# Patient Record
Sex: Male | Born: 1965 | Race: White | Hispanic: No | Marital: Married | Smoking: Never smoker
Health system: Southern US, Community
[De-identification: ages and names within clinical notes are randomized; demographics above are authoritative.]

## PROBLEM LIST (undated history)

## (undated) HISTORY — PX: MANDIBLE SURGERY: SHX707

---

## 2019-06-24 ENCOUNTER — Emergency Department: Payer: PRIVATE HEALTH INSURANCE

## 2019-06-24 ENCOUNTER — Encounter: Payer: Self-pay | Admitting: Radiology

## 2019-06-24 ENCOUNTER — Emergency Department
Admission: EM | Admit: 2019-06-24 | Discharge: 2019-06-24 | Disposition: A | Discharge status: Home / Self Care | Payer: PRIVATE HEALTH INSURANCE | Attending: Student | Admitting: Student

## 2019-06-24 ENCOUNTER — Other Ambulatory Visit: Payer: Self-pay

## 2019-06-24 DIAGNOSIS — G9389 Other specified disorders of brain: Secondary | ICD-10-CM

## 2019-06-24 DIAGNOSIS — R471 Dysarthria and anarthria: Secondary | ICD-10-CM | POA: Insufficient documentation

## 2019-06-24 DIAGNOSIS — Z79899 Other long term (current) drug therapy: Secondary | ICD-10-CM | POA: Insufficient documentation

## 2019-06-24 DIAGNOSIS — Z8639 Personal history of other endocrine, nutritional and metabolic disease: Secondary | ICD-10-CM | POA: Insufficient documentation

## 2019-06-24 DIAGNOSIS — I10 Essential (primary) hypertension: Secondary | ICD-10-CM | POA: Diagnosis not present

## 2019-06-24 DIAGNOSIS — R4789 Other speech disturbances: Secondary | ICD-10-CM | POA: Diagnosis present

## 2019-06-24 LAB — COMPREHENSIVE METABOLIC PANEL
ALT: 48 U/L — ABNORMAL HIGH (ref 0–44)
AST: 42 U/L — ABNORMAL HIGH (ref 15–41)
Albumin: 4.3 g/dL (ref 3.5–5.0)
Alkaline Phosphatase: 68 U/L (ref 38–126)
Anion gap: 9 (ref 5–15)
BUN: 19 mg/dL (ref 6–20)
CO2: 25 mmol/L (ref 22–32)
Calcium: 9.3 mg/dL (ref 8.9–10.3)
Chloride: 99 mmol/L (ref 98–111)
Creatinine, Ser: 0.9 mg/dL (ref 0.61–1.24)
GFR calc Af Amer: >60 mL/min (ref >60)
GFR calc non Af Amer: >60 mL/min (ref >60)
Glucose, Bld: 154 mg/dL — ABNORMAL HIGH (ref 70–99)
Potassium: 4 mmol/L (ref 3.5–5.1)
Sodium: 133 mmol/L — ABNORMAL LOW (ref 135–145)
Total Bilirubin: 1.5 mg/dL — ABNORMAL HIGH (ref 0.3–1.2)
Total Protein: 9.7 g/dL — ABNORMAL HIGH (ref 6.5–8.1)

## 2019-06-24 LAB — DIFFERENTIAL
Abs Immature Granulocytes: 0.05 10*3/uL (ref 0.00–0.07)
Basophils Absolute: 0 10*3/uL (ref 0.0–0.1)
Basophils Relative: 0 %
Eosinophils Absolute: 0 10*3/uL (ref 0.0–0.5)
Eosinophils Relative: 0 %
Immature Granulocytes: 1 %
Lymphocytes Relative: 18 %
Lymphs Abs: 2 10*3/uL (ref 0.7–4.0)
Monocytes Absolute: 0.4 10*3/uL (ref 0.1–1.0)
Monocytes Relative: 4 %
Neutro Abs: 8.3 10*3/uL — ABNORMAL HIGH (ref 1.7–7.7)
Neutrophils Relative %: 77 %

## 2019-06-24 LAB — CBC
HCT: 43.8 % (ref 39.0–52.0)
Hemoglobin: 15 g/dL (ref 13.0–17.0)
MCH: 32.9 pg (ref 26.0–34.0)
MCHC: 34.2 g/dL (ref 30.0–36.0)
MCV: 96.1 fL (ref 80.0–100.0)
Platelets: 351 10*3/uL (ref 150–400)
RBC: 4.56 MIL/uL (ref 4.22–5.81)
RDW: 12 % (ref 11.5–15.5)
WBC: 10.8 10*3/uL — ABNORMAL HIGH (ref 4.0–10.5)
nRBC: 0 % (ref 0.0–0.2)

## 2019-06-24 LAB — PROTIME-INR
INR: 1 (ref 0.8–1.2)
Prothrombin Time: 13.5 seconds (ref 11.4–15.2)

## 2019-06-24 LAB — APTT: aPTT: 27 seconds (ref 24–36)

## 2019-06-24 MED ORDER — PREDNISONE 10 MG PO TABS: ORAL_TABLET | ORAL | 0 refills | Status: AC

## 2019-06-24 MED ORDER — IOHEXOL 300 MG/ML  SOLN
125.0000 mL | Freq: Once | INTRAMUSCULAR | Status: AC | PRN
  Administered 2019-06-24: 125 mL via INTRAVENOUS

## 2019-06-24 MED ORDER — GADOBUTROL 1 MMOL/ML IV SOLN
10.0000 mL | Freq: Once | INTRAVENOUS | Status: AC | PRN
  Administered 2019-06-24: 10 mL via INTRAVENOUS

## 2019-06-24 MED ORDER — SODIUM CHLORIDE 0.9% FLUSH: 3.0000 mL | Freq: Once | INTRAVENOUS | Status: DC

## 2019-06-24 NOTE — ED Notes (Signed)
E-signature not working at this time. Pt verbalized understanding of D/C instructions, prescriptions and follow up care with no further questions at this time. Pt in NAD and ambulatory at time of D/C.  

## 2019-06-24 NOTE — ED Notes (Signed)
Pt ambulated to bathroom with a steady gait. Wife at bedside.

## 2019-06-24 NOTE — ED Provider Notes (Addendum)
[facility] Emergency Department Provider Note  ____________________________________________   First MD Initiated Contact with Patient 06/24/19 1446     (approximate)  I have reviewed the triage vital signs and the nursing notes.  History  Chief Complaint Aphasia    HPI Charles Ayers is a 54 y.o. male with history of hypertension, HLD who presents emergency department for speech difficulties. Patient states symptoms first started on Thursday, 4/8. They seem to start acutely around 8:30 AM. States he woke up around 6 AM and was at his baseline. Since 8:30 AM on Thursday he has had difficulty talking. He largely attributes this to difficulty coordinating his tongue. He feels like he cannot move his tongue like he normally does, especially having difficulty pressing it against his bottom teeth. He is having some difficulty swallowing because of this discoordination as well. No difficulty breathing. Has had a mild, pressure like headache to the occiput area for about a month. Denies any other associated neurological symptoms. No visual changes, facial droop, extremity weakness, numbness, or tingling. Denies any history of similar symptoms. No unintentional weight loss, night sweats. No personal history of cancer.   Past Medical Hx HTN HLD  Problem List HTN HLD  Past Surgical Hx Non-contributory  Medications Statin Telmisartan-HCTZ  Allergies Patient has no allergy information on record.  Family Hx No family history on file.  Social Hx Social History   Tobacco Use  . Smoking status: Not on file  Substance Use Topics  . Alcohol use: Not on file  . Drug use: Not on file     Review of Systems  Constitutional: Negative for fever. Negative for chills. Eyes: Negative for visual changes. ENT: Negative for sore throat. Cardiovascular: Negative for chest pain. Respiratory: Negative for shortness of breath. Gastrointestinal: Negative for nausea.  Negative for vomiting.  Genitourinary: Negative for dysuria. Musculoskeletal: Negative for leg swelling. Skin: Negative for rash. Neurological: Positive for headaches. Positive for dysarthria, abnormal tongue coordination.   Physical Exam  Vital Signs: ED Triage Vitals  Enc Vitals Group     BP 06/24/19 1346 (!) 179/99     Pulse Rate 06/24/19 1346 (!) 113     Resp 06/24/19 1346 18     Temp 06/24/19 1355 98.6 F (37 C)     Temp Source 06/24/19 1346 Oral     SpO2 06/24/19 1346 98 %     Weight 06/24/19 1346 265 lb (120.2 kg)     Height 06/24/19 1346 6' (1.829 m)     Head Circumference --      Peak Flow --      Pain Score 06/24/19 1345 5     Pain Loc --      Pain Edu? --      Excl. in [place]? --     Constitutional: Alert and oriented. Well appearing. NAD.  Head: Normocephalic. Atraumatic. Eyes: Conjunctivae clear. Sclera anicteric. Pupils equal and symmetric. EOMI. Nose: No masses or lesions. No congestion or rhinorrhea. Mouth/Throat: Wearing mask.  Neck: No stridor. Trachea midline.  Cardiovascular: Normal rate, regular rhythm. Extremities well perfused. Respiratory: Normal respiratory effort.  Lungs CTAB. Gastrointestinal: Soft. Non-distended. Non-tender.  Genitourinary: Deferred. Musculoskeletal: No lower extremity edema. No deformities. Neurologic: Alert and oriented. Mild dysarthria. No word finding difficulties or aphasia. Unable to press tongue against lower inner teeth. UE and LE strength 5/5 and symmetric. SILT.  Skin: Skin is warm, dry and intact. No rash noted. Psychiatric: Mood and affect are appropriate for situation.  EKG  Personally reviewed and interpreted by myself.   Date: 06/24/19 Time: 1422 Rate: 113 Rhythm: sinus Axis: borderline leftward Intervals: WNL Sinus tachycardia No STEMI    Radiology  Personally reviewed available imaging myself.   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 The  mass is  expansile and slightly hyperdense to brain. The mass is  mildly expansile and there may be some early intracranial extension  of tumor posteriorly. No internal calcifications are seen.  Differential diagnosis includes chordoma, chondroid tumor, and  metastatic disease. The patient had no given history of malignancy  at this time. MRI skull base without with contrast recommended for  further evaluation    Procedures  Procedure(s) performed (including critical care):  Procedures   Initial Impression / Assessment and Plan / MDM / ED Course  54 y.o. male who presents to the ED for dysarthria and difficulty w/ tongue coordination. Exam as above.   CT scan initiated in triage reveals a large central skull base tumor. Discussed w/ radiology and Neurosurgery Dr. Adriana Simas, added on MRI imaging per their recommendation. Advised discussed with [facility] for further guidance as well (patient/family requests [facility] care if need for transfer).   Discussed case with Dr. Zachery Conch at [facility] (tumor specialist) who states based on MRI imaging he reviewed, patient would be okay for close outpatient follow up as the mass is not creating obstructive effects on brain stem. Does not require emergent surgical intervention/admission at this time. Dr. Zachery Conch would be more than happy to still see the patient as ED to ED transfer if family desires in person evaluation/conversation with him.   Family states they would like to talk to NSGY, but would feel comfortable talking to Dr. Adriana Simas here, as he has been in touch with Dr. Zachery Conch regarding the case as well. This is preferable for them as it would obviate the need for transfer.  Dr. Adriana Simas will evaluate patient here and discuss case. Anticipate discharge after imaging results/discussion with Dr. Adriana Simas pending no major changes.   _______________________________   As part of my medical decision making I have reviewed available labs, radiology tests, reviewed old records/chart  review, obtained additional history from family, and discussed with consultants (radiology, Dr. Chestine Spore, and NSGY, Dr. Adriana Simas).     Final Clinical Impression(s) / ED Diagnosis  Final diagnoses:  Dysarthria  Brain mass       Note:  This document was prepared using Dragon voice recognition software and may include unintentional dictation errors.     Miguel Aschoff., MD 06/24/19 [phone]

## 2019-06-24 NOTE — Consult Note (Signed)
Neurosurgery-New Consultation Evaluation 06/24/2019 Charles Ayers [id number]  Identifying Statement: Charles Ayers is a 54 y.o. male from [place] [address] with speech difficulty and tongue weakness  Physician Requesting Consultation: [facility]  History of Present Illness: Charles Ayers is here for evaluation of dysarthria and tongue difficulty that he is noted started approximately a week ago.  He denies any numbness, weakness, vision changes, or hearing changes.  He does have some chronic decreased right-sided hearing.  He does not recall any inciting events or gradual decline and  developed with rather rapid onset.  On arrival to the emergency department, he did undergo CT of the head which revealed a lytic lesion within the clivus and went for MRI of the brain for further evaluation.  He has no personal history of malignancy.  Past Medical History:  No significant past medical history  Social History: Social History   Socioeconomic History  . Marital status: Married    Spouse name: Not on file  . Number of children: Not on file  . Years of education: Not on file  . Highest education level: Not on file  Occupational History  . Not on file  Tobacco Use  . Smoking status: Not on file  Substance and Sexual Activity  . Alcohol use: Not on file  . Drug use: Not on file  . Sexual activity: Not on file  Other Topics Concern  . Not on file  Social History Narrative  . Not on file   Social Determinants of Health   Financial Resource Strain:   . Difficulty of Paying Living Expenses:   Food Insecurity:   . Worried About Programme researcher, broadcasting/film/video in the Last Year:   . Barista in the Last Year:   Transportation Needs:   . Freight forwarder (Medical):   Marland Kitchen Lack of Transportation (Non-Medical):   Physical Activity:   . Days of Exercise per Week:   . Minutes of Exercise per Session:   Stress:   . Feeling of Stress :   Social Connections:   .  Frequency of Communication with Friends and Family:   . Frequency of Social Gatherings with Friends and Family:   . Attends Religious Services:   . Active Member of Clubs or Organizations:   . Attends Banker Meetings:   Marland Kitchen Marital Status:   Intimate Partner Violence:   . Fear of Current or Ex-Partner:   . Emotionally Abused:   Marland Kitchen Physically Abused:   . Sexually Abused:    Living arrangements (living alone, with partner): Arrived with spouse  Family History: No history of neurologic malignancy  Review of Systems:  Review of Systems - General ROS: Negative Psychological ROS: Negative Ophthalmic ROS: Negative ENT ROS: Negative Hematological and Lymphatic ROS: Negative  Endocrine ROS: Negative Respiratory ROS: Negative Cardiovascular ROS: Negative Gastrointestinal ROS: Negative Genito-Urinary ROS: Negative Musculoskeletal ROS: Negative Neurological ROS: Positive for tongue weakness Dermatological ROS: Negative  Physical Exam: BP (!) 160/97   Pulse (!) 109   Temp 98.6 F (37 C)   Resp 17   Ht 6' (1.829 m)   Wt 120.2 kg   SpO2 97%   BMI 35.94 kg/m  Body mass index is 35.94 kg/m. Body surface area is 2.47 meters squared. General appearance: Alert, cooperative, in no acute distress Head: Normocephalic, atraumatic Eyes: Normal, EOM intact Oropharynx: Wearing facemask Neck: Supple, range of motion appears full Ext: No edema in LE bilaterally, warm extremities  Neurologic exam:  Mental status: alertness: alert, orientation: person, place, time, affect: normal Speech: fluent but mildly dysarthric Cranial nerves:  II: Visual fields are full by confrontation, no ptosis III/IV/VI: extra-ocular motions intact bilaterally V/VII:no evidence of facial droop or weakness and facial sensation intact VIII: hearing decreased slightly to finger rub on the right XI: trapezius strength symmetric,  sternocleidomastoid strength symmetric XII: tongue deviates mildly to the  right although there is some atrophy noted on the left Motor:strength symmetric 5/5 in bilateral upper and lower extremities Sensory: intact to light touch in all extremities Gait: Not tested  Laboratory: Results for orders placed or performed during the hospital encounter of 06/24/19  Protime-INR  Result Value Ref Range   Prothrombin Time 13.5 11.4 - 15.2 seconds   INR 1.0 0.8 - 1.2  APTT  Result Value Ref Range   aPTT 27 24 - 36 seconds  CBC  Result Value Ref Range   WBC 10.8 (H) 4.0 - 10.5 K/uL   RBC 4.56 4.22 - 5.81 MIL/uL   Hemoglobin 15.0 13.0 - 17.0 g/dL   HCT 43.8 39.0 - 52.0 %   MCV 96.1 80.0 - 100.0 fL   MCH 32.9 26.0 - 34.0 pg   MCHC 34.2 30.0 - 36.0 g/dL   RDW 12.0 11.5 - 15.5 %   Platelets 351 150 - 400 K/uL   nRBC 0.0 0.0 - 0.2 %  Differential  Result Value Ref Range   Neutrophils Relative % 77 %   Neutro Abs 8.3 (H) 1.7 - 7.7 K/uL   Lymphocytes Relative 18 %   Lymphs Abs 2.0 0.7 - 4.0 K/uL   Monocytes Relative 4 %   Monocytes Absolute 0.4 0.1 - 1.0 K/uL   Eosinophils Relative 0 %   Eosinophils Absolute 0.0 0.0 - 0.5 K/uL   Basophils Relative 0 %   Basophils Absolute 0.0 0.0 - 0.1 K/uL   Immature Granulocytes 1 %   Abs Immature Granulocytes 0.05 0.00 - 0.07 K/uL  Comprehensive metabolic panel  Result Value Ref Range   Sodium 133 (L) 135 - 145 mmol/L   Potassium 4.0 3.5 - 5.1 mmol/L   Chloride 99 98 - 111 mmol/L   CO2 25 22 - 32 mmol/L   Glucose, Bld 154 (H) 70 - 99 mg/dL   BUN 19 6 - 20 mg/dL   Creatinine, Ser 0.90 0.61 - 1.24 mg/dL   Calcium 9.3 8.9 - 10.3 mg/dL   Total Protein 9.7 (H) 6.5 - 8.1 g/dL   Albumin 4.3 3.5 - 5.0 g/dL   AST 42 (H) 15 - 41 U/L   ALT 48 (H) 0 - 44 U/L   Alkaline Phosphatase 68 38 - 126 U/L   Total Bilirubin 1.5 (H) 0.3 - 1.2 mg/dL   GFR calc non Af Amer >60 >60 mL/min   GFR calc Af Amer >60 >60 mL/min   Anion gap 9 5 - 15   I personally reviewed labs  Imaging: Results for orders placed during the hospital  encounter of 06/24/19  MR Brain W and Wo Contrast   Narrative CLINICAL DATA:  Skull base mass.  EXAM: MRI HEAD WITHOUT AND WITH CONTRAST  TECHNIQUE: Multiplanar, multiecho pulse sequences of the brain and surrounding structures were obtained without and with intravenous contrast.  CONTRAST:  52mL GADAVIST GADOBUTROL 1 MMOL/ML IV SOLN  COMPARISON:  CT head 06/24/2019  FINDINGS: Brain: No acute infarction, hemorrhage, hydrocephalus, extra-axial collection or mass lesion. Few tiny subcortical white matter hyperintensities bilaterally. Normal enhancement of the brain.  Vascular: Normal arterial flow voids.  Skull and upper cervical spine: Large skull base mass measuring approximately 21 x 45 mm. The mass is hypointense to brain on T1 and T2 and shows homogeneous enhancement. The mass is in the central skull base in the midline extending more to the right than the left. Mass infiltrates most of the clivus and extends into the basiocciput. No calcifications were present on CT. The mass overlies the hypoglossal canal bilaterally accounting for tongue symptoms. There appears to be destruction of the posterior wall of the clivus inferiorly with probable tumor extending into the epidural space specially on the right. No mass-effect on the brainstem.  No other skeletal lesions are identified.  Sinuses/Orbits: Mild mucosal edema paranasal sinuses.  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 This shows bony destruction on CT and no tumoral calcification. The hypoglossal canal is involved by the mass accounting for tongue symptoms. Mild epidural tumor extension posteriorly particularly on the right.  Signal characteristics and lack of calcifications decrease the chances of this being a chordoma. Chondrosarcoma also less likely due to lack of calcifications. Metastatic disease possible however there is no known primary malignancy at this time.  Intraosseous meningioma is a consideration.  Neuro surgical evaluation recommended.   Electronically Signed   By: Marlan Palau M.D.   On: 06/24/2019 16:38    I personally reviewed radiology studies    Impression/Plan:  Charles Ayers is here for evaluation of what appears to be a large clival lesion.  We will get imaging to rule out any other signs of primary disease elsewhere.  Given his new symptoms we did discuss that we would recommend treatment which would consist of at minimum a biopsy but also likely resection given the location.  We discussed that this is usually done with colleagues from ENT and we will get them established as an outpatient in order to discuss further treatment.  I have recommended a short course of steroids today to see if this would help with his acute symptoms.   1.  Diagnosis: Large clival lesion  2.  Plan -Place referral as an outpatient for discussion of skull base tumor resection -Recommend body imaging to rule out any other primary disease -Safe for discharge from ED after imaging obtained

## 2019-06-24 NOTE — ED Notes (Signed)
Pt in MRI.

## 2019-06-24 NOTE — ED Triage Notes (Signed)
Pt c/o slurred speech and difficulty controlling his tongue sine last Thursday 4/8 around 830am, states he woke up fine around 6am that day. Pt has also c/o posterior head pain. Denies any injury or recent falls.

## 2019-06-24 NOTE — ED Provider Notes (Signed)
-----------------------------------------   6:40 PM on 06/24/2019 -----------------------------------------  I took over care of this patient from Dr. Colon Branch.  Dr. Adriana Simas from neurosurgery called with additional recommendations after speaking to the patient.  He requested a CT of the chest, abdomen, and pelvis for cancer staging and recommended that I start the patient on a taper of prednisone.  The CTs were obtained and are negative for acute findings other than a 4 mm lung nodule.  The patient is to follow-up at the skull base center at [facility], and will be contacted by the providers there.  I discussed the results of the CTs with him.  I confirmed that he understood the discharge instructions.  Return precautions were given, and he expressed understanding.   Dionne Bucy, MD 06/24/19 [phone]

## 2019-06-24 NOTE — Discharge Instructions (Addendum)
Thank you for letting us take care of you in the ER today.   Please call Dr. Santiago Glad [facility] Neurosurgery) number below to schedule a clinic appointment. If you do not hear back from the clinic for scheduling, you can call Dr. Zachery Conch at his cell phone number at [phone].   Please return to the ER for any new or worsening symptoms, including worsening headache, difficulty swallowing, breathing, or any other signs/symptoms concerning to you.

## 2019-07-07 ENCOUNTER — Other Ambulatory Visit: Payer: Self-pay | Admitting: Neurosurgery

## 2019-07-07 DIAGNOSIS — R1310 Dysphagia, unspecified: Secondary | ICD-10-CM

## 2019-07-07 DIAGNOSIS — G939 Disorder of brain, unspecified: Secondary | ICD-10-CM

## 2019-07-09 ENCOUNTER — Ambulatory Visit
Admission: RE | Admit: 2019-07-09 | Discharge: 2019-07-09 | Disposition: A | Discharge status: Home / Self Care | Payer: PRIVATE HEALTH INSURANCE | Source: Ambulatory Visit | Attending: Neurosurgery | Admitting: Neurosurgery

## 2019-07-09 ENCOUNTER — Other Ambulatory Visit: Payer: Self-pay

## 2019-07-09 DIAGNOSIS — G939 Disorder of brain, unspecified: Secondary | ICD-10-CM | POA: Diagnosis present

## 2019-07-09 DIAGNOSIS — R1310 Dysphagia, unspecified: Secondary | ICD-10-CM

## 2019-07-09 NOTE — Therapy (Signed)
[facility] DIAGNOSTIC RADIOLOGY [address] [place], [place], [address] Phone: [phone]   Fax:     Modified Barium Swallow  Patient Details  Name: Charles Ayers MRN: [id number] Date of Birth: 11/20/1965 No data recorded  Encounter Date: 07/09/2019  End of Session - 07/09/19 1331    Visit Number  1    Number of Visits  1    Date for SLP Re-Evaluation  07/09/19    Activity Tolerance  Patient tolerated treatment well          General - 07/09/19 1324      General Information   Date of Onset  07/01/19    HPI  Charles Ayers is a 54 year old gentleman, who noticed some difficulty with speaking, headache to back of his head and difficulty swallowing. Upon exam, he appeared to have fasciculations on both sides of his tongue. 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 Pt currently taking prednisone with improved speech and swallowing abilities at time of MBSS (07/09/2019).    Type of Study  MBS-Modified Barium Swallow Study    Previous Swallow Assessment  none in chart    Diet Prior to this Study  Regular;Thin liquids    Temperature Spikes Noted  No    Respiratory Status  Room air    History of Recent Intubation  No    Behavior/Cognition  Alert;Cooperative;Pleasant mood    Oral Cavity Assessment  Within Functional Limits    Oral Care Completed by SLP  No    Oral Cavity - Dentition  Adequate natural dentition    Vision  Functional for self feeding    Self-Feeding Abilities  Able to feed self    Patient Positioning  Upright in chair    Baseline Vocal Quality  Normal    Volitional Cough  Strong    Volitional Swallow  Able to elicit    Anatomy  Within functional limits    Pharyngeal Secretions  Not observed secondary MBS         Oral Preparation/Oral Phase - 07/09/19 1330      Oral Preparation/Oral Phase   Oral Phase  Within functional limits      Electrical stimulation - Oral Phase   Was Electrical Stimulation Used  No       Pharyngeal Phase - 07/09/19 1330      Pharyngeal Phase   Pharyngeal Phase  Within functional limits      Electrical Stimulation - Pharyngeal Phase   Was Electrical Stimulation Used  No       Cricopharyngeal Phase - 07/09/19 1330      Cervical Esophageal Phase   Cervical Esophageal Phase  Within functional limits              Plan - 07/09/19 1338    Clinical Impression Statement  Pt doesn't have any oropharyngeal dysphagia at this time. Pt reports that he is currently taking prednisone which greatly improves his speech intelligibility and his ability to manipulate POs orally. Given location of tumor, would recommend follow up for assessment of dysphagia post surgery.    Consulted and Agree with Plan of Care  Patient;Family member/caregiver    Family Member Consulted  wife         Recommendations/Treatment - 07/09/19 1330      Swallow Evaluation Recommendations   SLP Diet Recommendations  Age  appropriate regular;Thin    Liquid Administration via  Cup;Straw    Medication Administration  Whole meds with liquid    Postural Changes  Seated upright at 90 degrees       Prognosis - 07/09/19 1330      Individuals Consulted   Consulted and Agree with Results and Recommendations  Patient;Family member/caregiver    Family Member Consulted  pt's wife       Dailey Buccheri B. Rutherford Nail M.S., CCC-SLP, [facility] [place] DIAGNOSTIC RADIOLOGY [place], [place], [address] Phone: [phone]   Fax:     Name: Charles Ayers MRN: [id number] Date of Birth: December 05, 1965

## 2019-07-17 ENCOUNTER — Emergency Department
Admission: EM | Admit: 2019-07-17 | Discharge: 2019-07-17 | Disposition: A | Discharge status: Home / Self Care | Payer: PRIVATE HEALTH INSURANCE | Attending: Emergency Medicine | Admitting: Emergency Medicine

## 2019-07-17 ENCOUNTER — Other Ambulatory Visit: Payer: Self-pay

## 2019-07-17 ENCOUNTER — Emergency Department: Payer: PRIVATE HEALTH INSURANCE

## 2019-07-17 DIAGNOSIS — R1310 Dysphagia, unspecified: Secondary | ICD-10-CM | POA: Insufficient documentation

## 2019-07-17 DIAGNOSIS — R0989 Other specified symptoms and signs involving the circulatory and respiratory systems: Secondary | ICD-10-CM | POA: Insufficient documentation

## 2019-07-17 DIAGNOSIS — R900 Intracranial space-occupying lesion found on diagnostic imaging of central nervous system: Secondary | ICD-10-CM | POA: Insufficient documentation

## 2019-07-17 DIAGNOSIS — R0602 Shortness of breath: Secondary | ICD-10-CM | POA: Diagnosis present

## 2019-07-17 DIAGNOSIS — Z9889 Other specified postprocedural states: Secondary | ICD-10-CM | POA: Diagnosis not present

## 2019-07-17 DIAGNOSIS — R6889 Other general symptoms and signs: Secondary | ICD-10-CM

## 2019-07-17 LAB — COMPREHENSIVE METABOLIC PANEL
ALT: 50 U/L — ABNORMAL HIGH (ref 0–44)
AST: 31 U/L (ref 15–41)
Albumin: 4.1 g/dL (ref 3.5–5.0)
Alkaline Phosphatase: 106 U/L (ref 38–126)
Anion gap: 10 (ref 5–15)
BUN: 16 mg/dL (ref 6–20)
CO2: 24 mmol/L (ref 22–32)
Calcium: 9 mg/dL (ref 8.9–10.3)
Chloride: 95 mmol/L — ABNORMAL LOW (ref 98–111)
Creatinine, Ser: 0.72 mg/dL (ref 0.61–1.24)
GFR calc Af Amer: >60 mL/min (ref >60)
GFR calc non Af Amer: >60 mL/min (ref >60)
Glucose, Bld: 132 mg/dL — ABNORMAL HIGH (ref 70–99)
Potassium: 3.5 mmol/L (ref 3.5–5.1)
Sodium: 129 mmol/L — ABNORMAL LOW (ref 135–145)
Total Bilirubin: 1.2 mg/dL (ref 0.3–1.2)
Total Protein: 8.2 g/dL — ABNORMAL HIGH (ref 6.5–8.1)

## 2019-07-17 LAB — CBC WITH DIFFERENTIAL/PLATELET
Abs Immature Granulocytes: 0.06 10*3/uL (ref 0.00–0.07)
Basophils Absolute: 0 10*3/uL (ref 0.0–0.1)
Basophils Relative: 0 %
Eosinophils Absolute: 0 10*3/uL (ref 0.0–0.5)
Eosinophils Relative: 0 %
HCT: 39.7 % (ref 39.0–52.0)
Hemoglobin: 14.2 g/dL (ref 13.0–17.0)
Immature Granulocytes: 1 %
Lymphocytes Relative: 14 %
Lymphs Abs: 1.3 10*3/uL (ref 0.7–4.0)
MCH: 32.9 pg (ref 26.0–34.0)
MCHC: 35.8 g/dL (ref 30.0–36.0)
MCV: 92.1 fL (ref 80.0–100.0)
Monocytes Absolute: 0.6 10*3/uL (ref 0.1–1.0)
Monocytes Relative: 6 %
Neutro Abs: 7.1 10*3/uL (ref 1.7–7.7)
Neutrophils Relative %: 79 %
Platelets: 254 10*3/uL (ref 150–400)
RBC: 4.31 MIL/uL (ref 4.22–5.81)
RDW: 12.1 % (ref 11.5–15.5)
WBC: 9.1 10*3/uL (ref 4.0–10.5)
nRBC: 0 % (ref 0.0–0.2)

## 2019-07-17 MED ORDER — IOHEXOL 300 MG/ML  SOLN
75.0000 mL | Freq: Once | INTRAMUSCULAR | Status: AC | PRN
  Administered 2019-07-17: 17:00:00 75 mL via INTRAVENOUS

## 2019-07-17 MED ORDER — SODIUM CHLORIDE 0.9 % IV BOLUS
1000.0000 mL | Freq: Once | INTRAVENOUS | Status: AC
  Administered 2019-07-17: 18:00:00 1000 mL via INTRAVENOUS

## 2019-07-17 NOTE — ED Notes (Signed)
Pt transported to CT at this time.

## 2019-07-17 NOTE — ED Triage Notes (Addendum)
Pt states he had surgery last Friday on his mandible for a possible tumor and had a check up today to do a swallow study- after he got home he was having difficulty breathing and swallowing- pt states he took dexamine to help with the swelling and since then has had a little bit of relief- pt states when he closes his mouth to breath through his nose he felt a "flap closing" and had a hard time breathing

## 2019-07-17 NOTE — ED Provider Notes (Signed)
[facility] Emergency Department Provider Note  ____________________________________________  Time seen: Approximately 7:43 PM  I have reviewed the triage vital signs and the nursing notes.   HISTORY  Chief Complaint Shortness of Breath and Aphasia    HPI Charles Ayers is a 54 y.o. male who comes the ED complaining of sensation of throat swelling and difficulty swallowing that started this afternoon after having a swallow study done.  Patient recently had a neurosurgical procedure to remove a mass from the skull base.  Reviewed electronic medical record which does show that this was done with microsurgical techniques and continuous cranial nerve monitoring without evidence of significant nerve injury during the procedure.  Patient denies headache vision change or focal paresthesia or weakness.  He took dexamethasone at home 8 mg, feels like his symptoms are improving on arrival to the ED.  Denies chest pain shortness of breath or cough.  No rash.      History reviewed. No pertinent past medical history.   There are no problems to display for this patient.    Past Surgical History:  Procedure Laterality Date  . MANDIBLE SURGERY       Prior to Admission medications   Medication Sig Start Date End Date Taking? Authorizing Provider  predniSONE (DELTASONE) 10 MG tablet Take 6 tablets (60 mg) by mouth on day 1, 5 tabs on day 2, 4 tabs on day 3, 3 tabs on day 4, 2 tabs on day 5, and 1 tab on day 6 06/24/19   Dionne Bucy, MD     Allergies Patient has no known allergies.   No family history on file.  Social History Social History   Tobacco Use  . Smoking status: Never Smoker  . Smokeless tobacco: Never Used  Substance Use Topics  . Alcohol use: Not Currently  . Drug use: Not on file    Review of Systems  Constitutional:   No fever or chills.  ENT:   No sore throat. No rhinorrhea.  Positive difficulty swallowing Cardiovascular:   No  chest pain or syncope. Respiratory:   No dyspnea or cough. Gastrointestinal:   Negative for abdominal pain, vomiting and diarrhea.  Musculoskeletal:   Negative for focal pain or swelling All other systems reviewed and are negative except as documented above in ROS and HPI.  ____________________________________________   PHYSICAL EXAM:  VITAL SIGNS: ED Triage Vitals  Enc Vitals Group     BP 07/17/19 1354 (!) 173/111     Pulse Rate 07/17/19 1350 96     Resp 07/17/19 1350 16     Temp 07/17/19 1350 98.1 F (36.7 C)     Temp Source 07/17/19 1350 Oral     SpO2 07/17/19 1350 100 %     Weight 07/17/19 1349 255 lb (115.7 kg)     Height 07/17/19 1349 6' (1.829 m)     Head Circumference --      Peak Flow --      Pain Score 07/17/19 1355 3     Pain Loc --      Pain Edu? --      Excl. in GC? --     Vital signs reviewed, nursing assessments reviewed.   Constitutional:   Alert and oriented. Non-toxic appearance. Eyes:   Conjunctivae are normal. EOMI. PERRL. ENT      Head:   Normocephalic and atraumatic.  Right parieto-occipital surgical incision healing well, no inflammatory changes or drainage      Nose:   Wearing  a mask.      Mouth/Throat:   Normal      Neck: Limited tongue mobility, no tongue deviation, no oropharyngeal erythema.  Unable to visualize peritonsillar spaces Hematological/Lymphatic/Immunilogical:   No cervical lymphadenopathy. Cardiovascular:   RRR. Symmetric bilateral radial and DP pulses.  No murmurs. Cap refill less than 2 seconds. Respiratory:   Normal respiratory effort without tachypnea/retractions. Breath sounds are clear and equal bilaterally. No wheezes/rales/rhonchi. Gastrointestinal:   Soft and nontender. Non distended. There is no CVA tenderness.  No rebound, rigidity, or guarding.  Musculoskeletal:   Normal range of motion in all extremities. No joint effusions.  No lower extremity tenderness.  No edema. Neurologic:   Dysarthric speech.  Normal  language Motor grossly intact. No acute focal neurologic deficits are appreciated.  Skin:    Skin is warm, dry and intact. No rash noted.  No petechiae, purpura, or bullae.  ____________________________________________    LABS (pertinent positives/negatives) (all labs ordered are listed, but only abnormal results are displayed) Labs Reviewed  COMPREHENSIVE METABOLIC PANEL - Abnormal; Notable for the following components:      Result Value   Sodium 129 (*)    Chloride 95 (*)    Glucose, Bld 132 (*)    Total Protein 8.2 (*)    ALT 50 (*)    All other components within normal limits  CBC WITH DIFFERENTIAL/PLATELET   ____________________________________________   EKG    ____________________________________________    RADIOLOGY   CLINICAL DATA: Recent skull base mass resection, difficulty breathing  EXAM: CT HEAD WITHOUT AND WITH CONTRAST  CT NECK WITH CONTRAST  TECHNIQUE: Contiguous axial images were obtained from the base of the skull through the vertex without and with intravenous contrast. Multidetector CT imaging of the and neck was performed using the standard protocol following the bolus administration of intravenous contrast.  CONTRAST: 95mL OMNIPAQUE IOHEXOL 300 MG/ML SOLN  COMPARISON: 06/24/2019  FINDINGS: CT HEAD FINDINGS  Brain: There is no acute intracranial hemorrhage, mass effect, or edema. Gray-white differentiation is preserved. Ventricles and sulci are normal in size and configuration. There is no extra-axial fluid collection.  Vascular: Intracranial right vertebral artery courses in proximity to the mass.  Skull: Destructive mass of the central skull base is again identified with mild ventral epidural extension, which may be slightly increased.  Sinuses/Orbits: Retained secretions of the left sphenoid sinus, with cyst is contiguous with the mass.  Other: Persistent right mastoid effusion.  CT NECK FINDINGS  Pharynx and larynx:  Soft tissue eroding the central skull base encroaches on the posterior nasopharynx. Remainder of pharynx, larynx, and subglottic airway are unremarkable.  Salivary glands: Unremarkable.  Thyroid: Normal.  Lymph nodes: There are no pathologically enlarged lymph nodes identified.  Vascular: Major neck vessels are patent. There is minor calcified plaque at the ICA origins.  Mastoids and visualized paranasal sinuses: Persistent right mastoid and middle ear effusions. Paranasal sinuses are clear.  Skeleton: As above. Central skull base mass eroding the clivus and basiocciput.  Upper chest: Unremarkable.  Other: There are postoperative changes in the posterior right neck along the right transcondylar approach with hematoma.  IMPRESSION: Postoperative changes of right transcondylar approach for presumed biopsy and small volume debulking. There is residual hematoma along the operative approach. Destructive central skull base mass is again identified with mild ventral epidural extension along the brainstem, which may be slightly increased. Airway is patent. ____________________________________________   PROCEDURES Procedures  ____________________________________________  DIFFERENTIAL DIAGNOSIS   RPA, PTA, pharyngeal edema, CNS hematoma  CLINICAL IMPRESSION / ASSESSMENT AND PLAN / ED COURSE  Medications ordered in the ED: Medications  sodium chloride 0.9 % bolus 1,000 mL (0 mLs Intravenous Stopped 07/17/19 1940)  iohexol (OMNIPAQUE) 300 MG/ML solution 75 mL (75 mLs Intravenous Contrast Given 07/17/19 1729)    Pertinent labs & imaging results that were available during my care of the patient were reviewed by me and considered in my medical decision making (see chart for details).  Charles Ayers was evaluated in Emergency Department on 07/17/2019 for the symptoms described in the history of present illness. He was evaluated in the context of the global COVID-19 pandemic, which  necessitated consideration that the patient might be at risk for infection with the SARS-CoV-2 virus that causes COVID-19. Institutional protocols and algorithms that pertain to the evaluation of patients at risk for COVID-19 are in a state of rapid change based on information released by regulatory bodies including the CDC and federal and state organizations. These policies and algorithms were followed during the patient's care in the ED.   Patient presents with sensation of throat swelling and difficulty swallowing after having a swallow study performed earlier today.  No evidence of anaphylaxis on arrival.  Vital signs are unremarkable.  Neurologic exam is nonfocal.  Due to his recent surgical procedure, will obtain CT scan of the head and neck to evaluate for complication.  If reassuring, I think patient can be discharged home to continue outpatient follow-up.  ----------------------------------------- 9:07 PM on 07/17/2019 -----------------------------------------  CT scan reassuring without acute findings.  Patient is ambulatory and feels back to his postprocedural baseline.  Discussed with neurosurgery Dr. Marcell Barlow who agrees that the dysarthria is baseline, and reviewed CT imaging and agrees there are no acute findings.  Recommends continuing the dexamethasone taper as prescribed, calling Dr. Kaylyn Lim office tomorrow for follow-up.      ____________________________________________   FINAL CLINICAL IMPRESSION(S) / ED DIAGNOSES    Final diagnoses:  Sensation of swollen throat  Tumor of skull base   ED Discharge Orders    None      Portions of this note were generated with dragon dictation software. Dictation errors may occur despite best attempts at proofreading.   Sharman Cheek, MD 07/17/19 2108

## 2019-07-17 NOTE — Discharge Instructions (Signed)
Your CT scan today was reassuring with expected post-operative findings.  We discussed the case with neurosurgery who agrees that there are no acute concerns at this time.  They advise you should continue the dexamethasone taper as prescribed and call Dr. Kaylyn Lim office tomorrow.

## 2020-12-12 IMAGING — MR MR CERVICAL SPINE WO/W CM
12 of 23 series · 22 of 48 positions shown · IV contrast (gadavist)
Comparison: CT head and MRI head earlier today

CLINICAL DATA: Skull base tumor.

EXAM:
MRI CERVICAL SPINE WITHOUT AND WITH CONTRAST
TECHNIQUE: Multiplanar and multiecho pulse sequences of the cervical spine, to
include the craniocervical junction and cervicothoracic junction,
were obtained without and with intravenous contrast.
CONTRAST:  10mL GADAVIST GADOBUTROL 1 MMOL/ML IV SOLN

[Series 5: T2 · sagittal · 3.0mm · 0.62mm/px · 2 of 15 slices shown (1 of 3)]
[im 1/15]
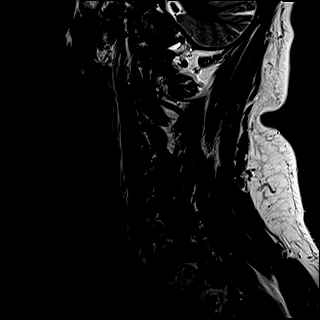
[im 15/15]
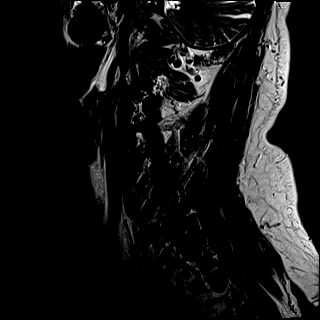

[Series 7: STIR · sagittal · 3.0mm · 0.62mm/px · 1 of 15 slices shown]
[im 1/15]
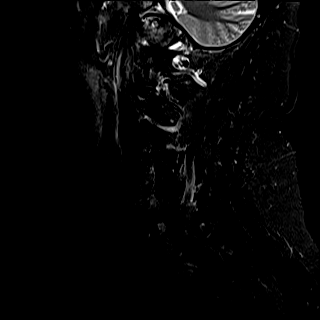

[Series 8: T2 · axial · 3.0mm · 0.70mm/px · 1 of 32 slices shown (2 of 3)]
[im 1/32]
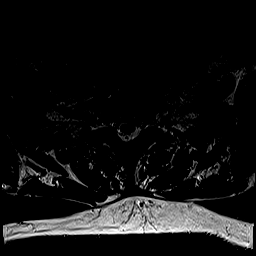

[Series 10: T1 · axial · non-contrast · 3.0mm · 0.35mm/px · 1 of 32 slices shown (1 of 4)]
[im 1/32]
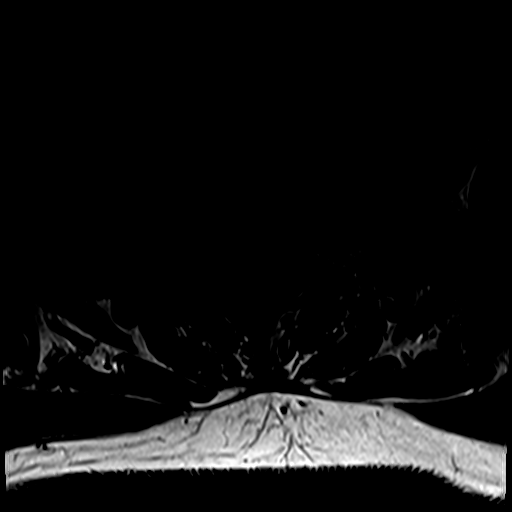

[Series 14: T2 · axial · 5.0mm · 0.53mm/px · 1 of 29 slices shown (3 of 3)]
[im 1/29]
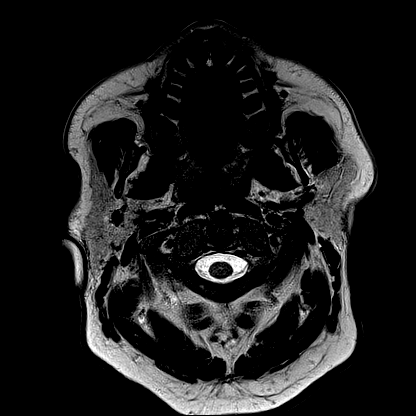

[Series 20: T1 · sagittal · 5.0mm · 0.62mm/px · 1 of 25 slices shown (2 of 4)]
[im 1/25]
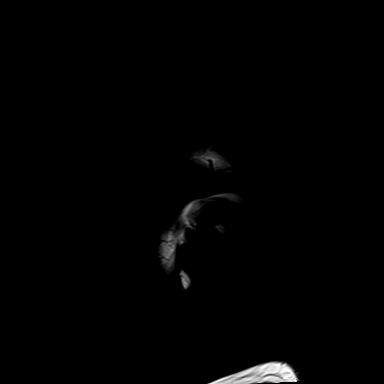

[Series 21: T1 · coronal · non-contrast · 3.0mm · 0.21mm/px · 2 of 45 slices shown (3 of 4)]
[im 1/45]
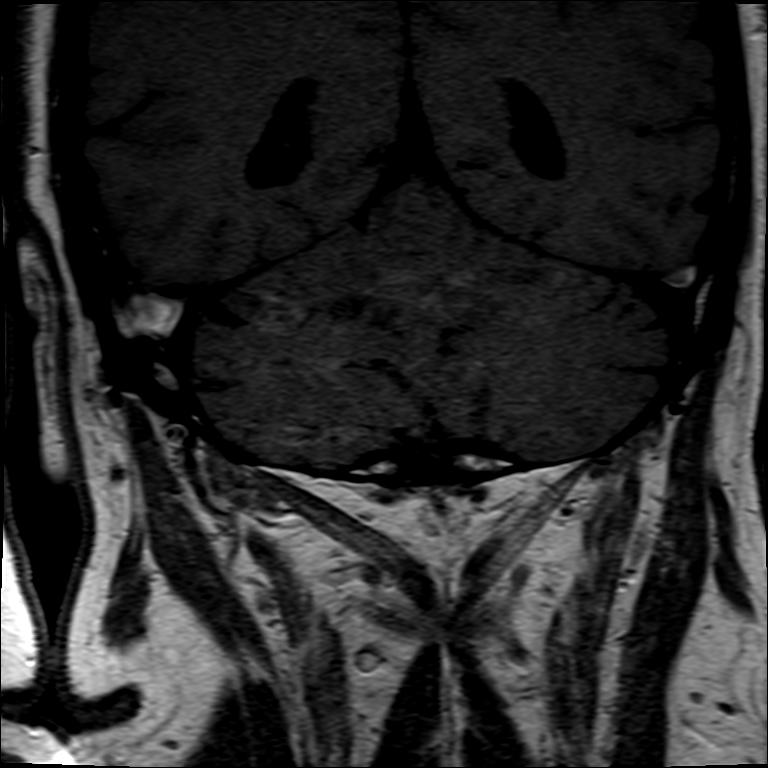
[im 45/45]
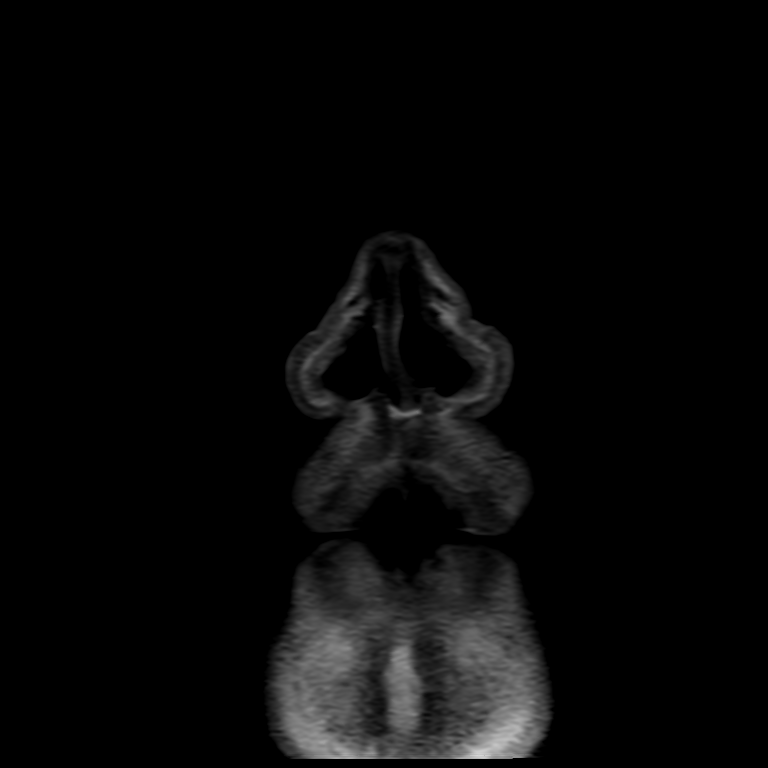

[Series 23: T1 · axial · non-contrast · 3.0mm · 0.21mm/px · 1 of 27 slices shown (4 of 4)]
[im 1/27]
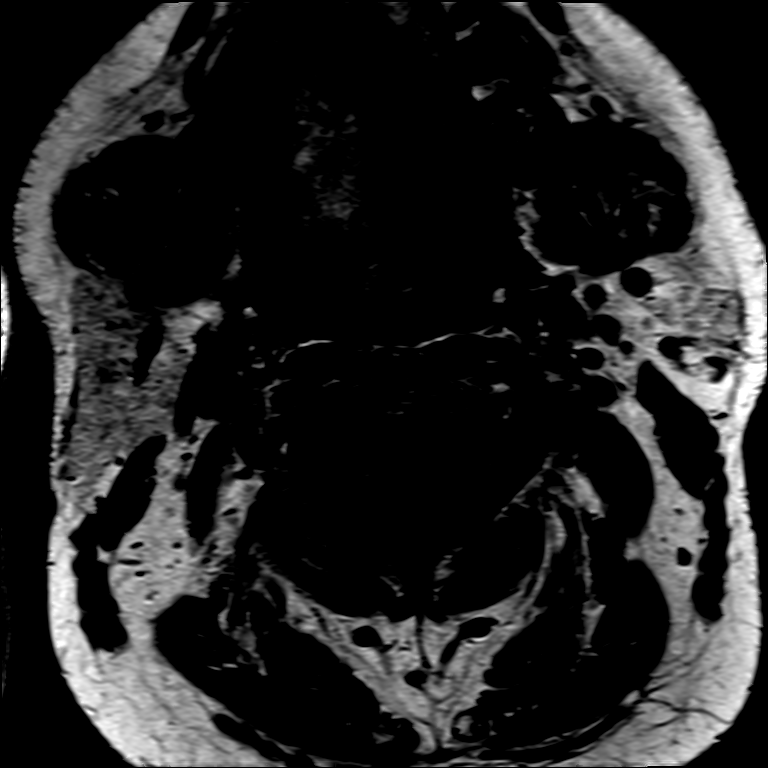

[Series 24: T1 post-contrast · axial · 3.0mm · 0.21mm/px · 1 of 27 slices shown (1 of 4)]
[im 1/27]
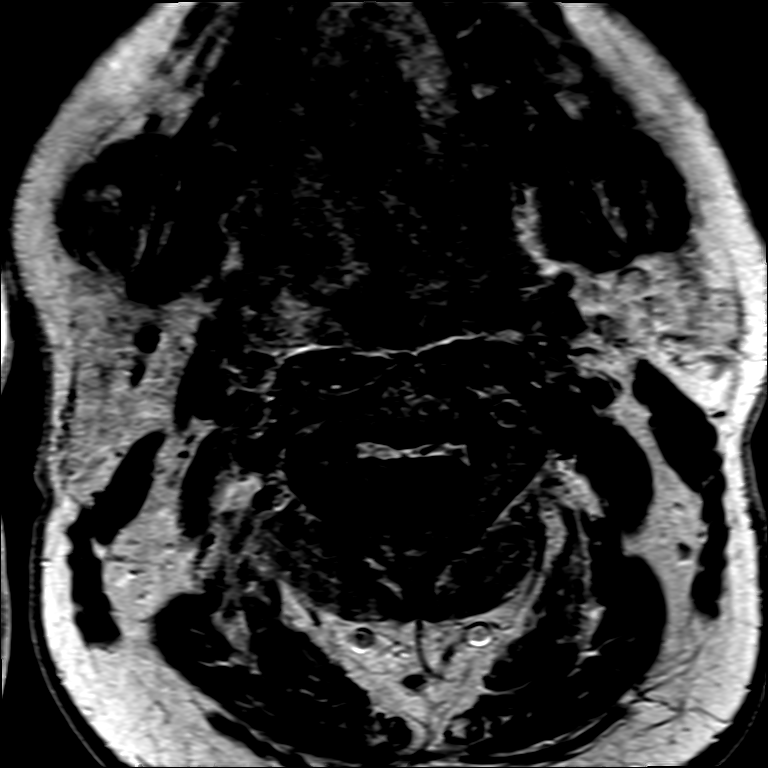

[Series 25: T1 post-contrast · coronal · 3.0mm · 0.21mm/px · 2 of 45 slices shown (2 of 4)]
[im 1/45]
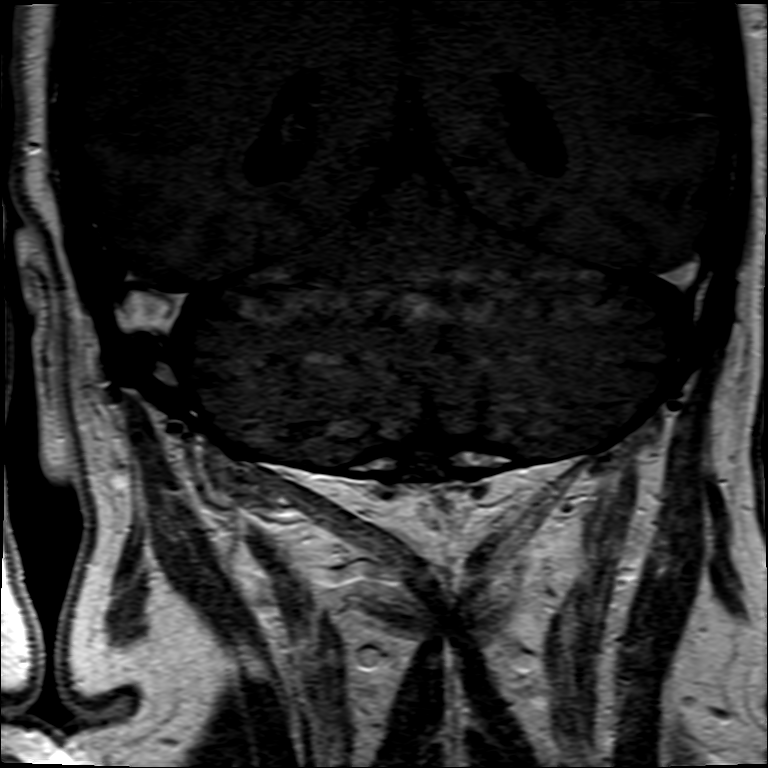
[im 45/45]
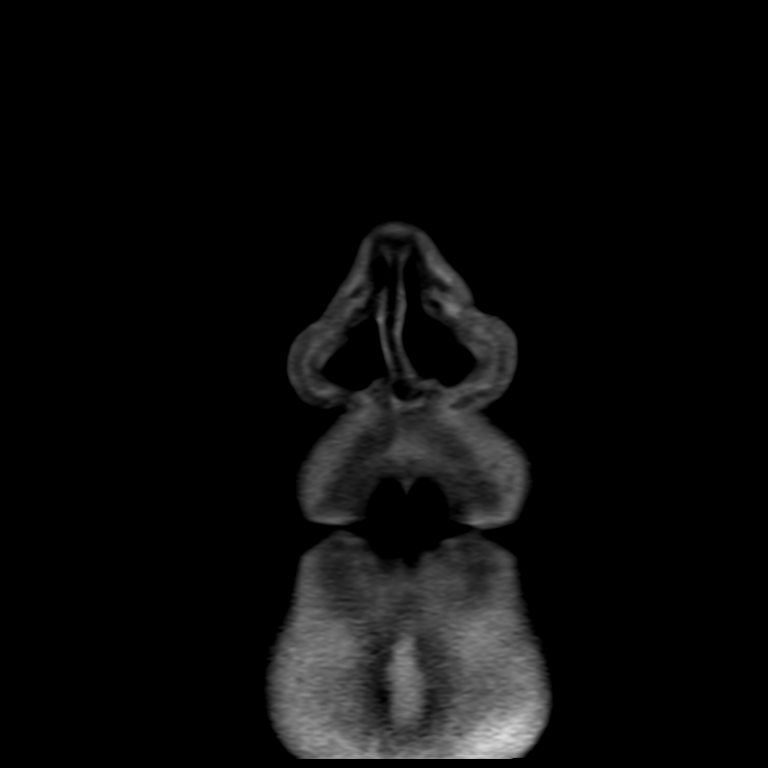

[Series 26: T1 post-contrast · axial · 1.0mm · 0.98mm/px · z∈[+26,+200]mm · 8 of 176 slices shown (3 of 4)]
[im 1/176]
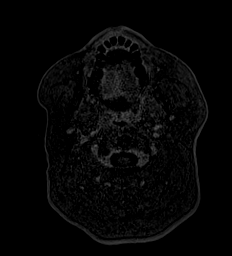
[im 26/176]
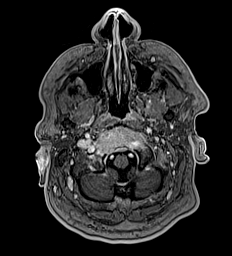
[im 51/176]
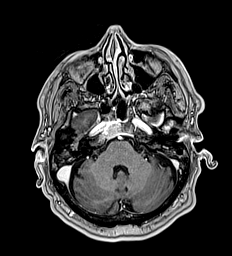
[im 76/176]
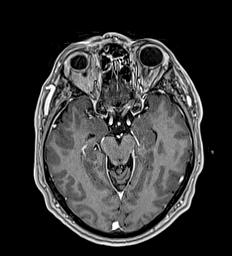
[im 101/176]
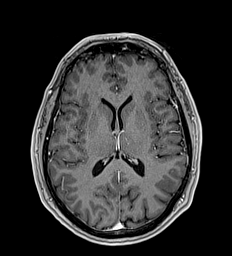
[im 126/176]
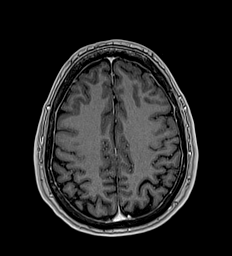
[im 151/176]
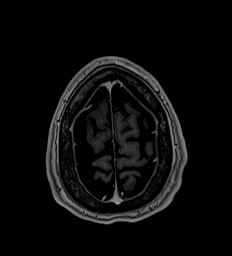
[im 176/176]
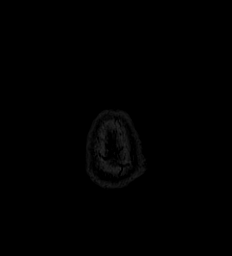

[Series 27: T1 post-contrast · coronal · 5.0mm · 0.57mm/px · 1 of 31 slices shown (4 of 4)]
[im 1/31]
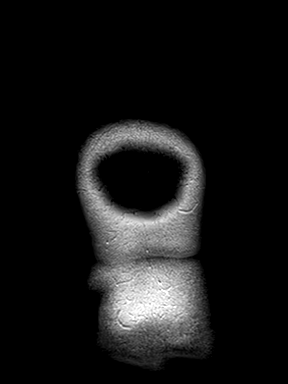

[22 of 48 positions shown; findings below may reference images not displayed]

FINDINGS: Alignment: Normal alignment with straightening of the cervical
lordosis.

Vertebrae: Enhancing mass lesion in the central skull base as
described on the MRI brain report.

No cervical spine mass identified. No fracture in the cervical
spine.

Cord: Normal cord signal.  No cord mass lesion identified.

Posterior Fossa, vertebral arteries, paraspinal tissues: Central
skull base mass as above. Otherwise negative

Disc levels:

C2-3: Negative

C3-4: Negative

C4-5: Moderately large central disc protrusion with cord flattening
and mild spinal stenosis. Mild foraminal stenosis on the left due to
spurring

C5-6: Disc degeneration with disc space narrowing and spurring.
Prominent left-sided osteophyte with moderate to severe left
foraminal encroachment. Moderate spinal stenosis left greater than
right. Mild right foraminal stenosis.

C6-7: Small central disc protrusion. Diffuse uncinate spurring and
mild foraminal stenosis bilaterally

C7-T1: Negative
IMPRESSION: 1. Enhancing mass lesion in the central skull base. Refer to MRI
brain and CT brain reports today.
2. No mass lesion or fracture in the cervical spine
3. Cervical spondylosis with spinal stenosis at C4-5 and C5-6.

## 2020-12-12 IMAGING — CT CT HEAD W/O CM
3 series · 15 of 47 positions shown, 18 images · non-contrast
Comparison: None.

CLINICAL DATA: Slurred speech and difficulty controlling tongue
since [REDACTED]

EXAM:
CT HEAD WITHOUT CONTRAST
TECHNIQUE: Contiguous axial images were obtained from the base of the skull
through the vertex without intravenous contrast.

[Series 2: head wo · axial · 0.47mm/px · z∈[-124,+16]mm · 9 of 34 slices shown, 12 images]
[im 3/34  brain]
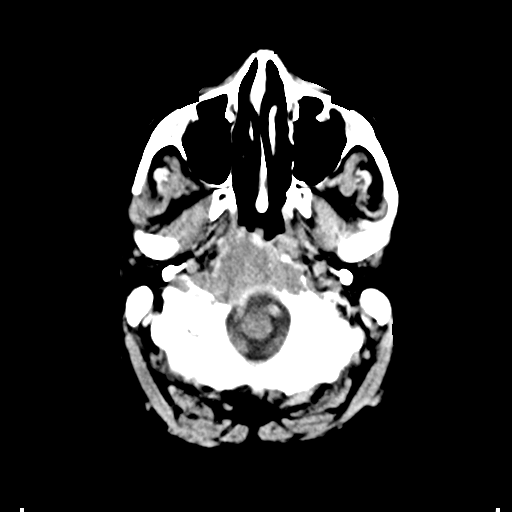
[im 3/34  bone]
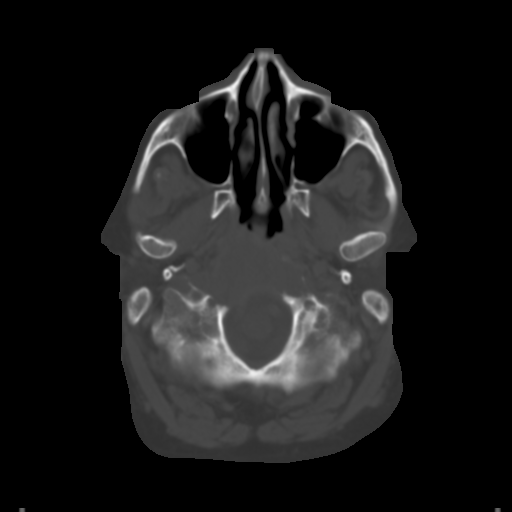
[im 6/34  brain]
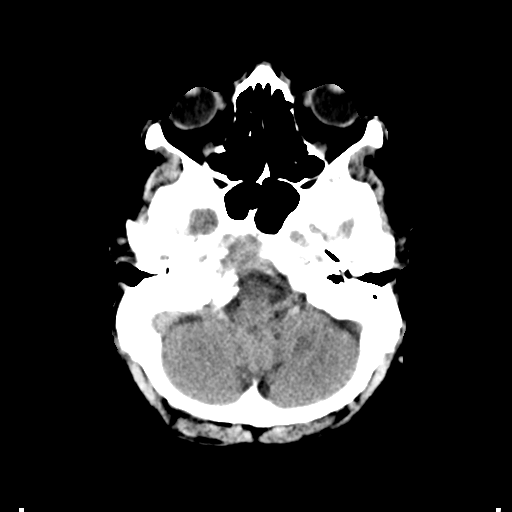
[im 10/34  brain]
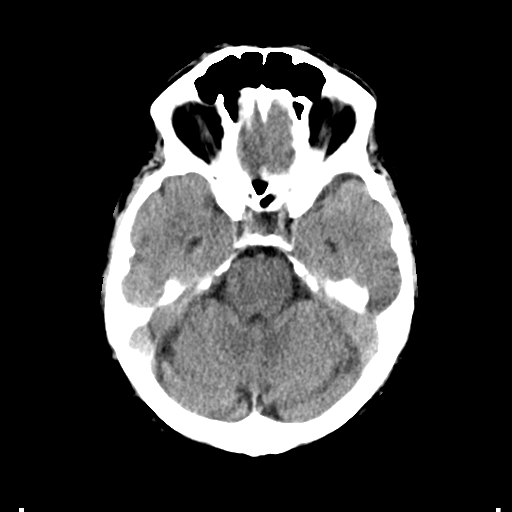
[im 13/34  brain]
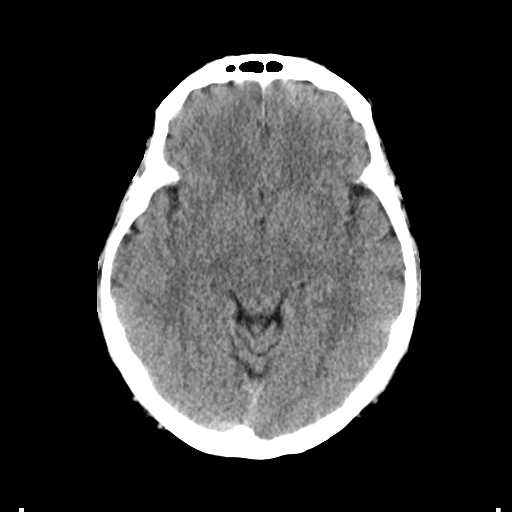
[im 18/34  brain]
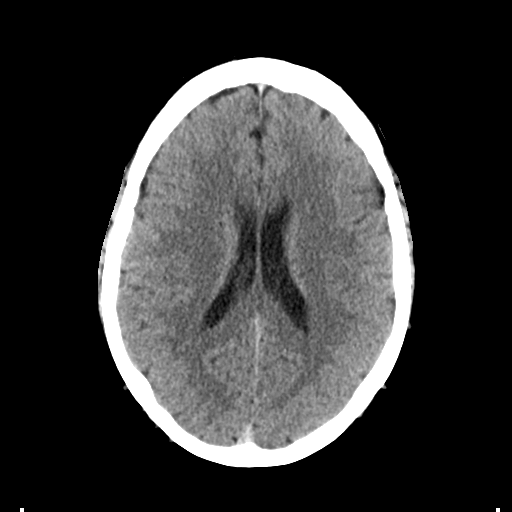
[im 18/34  bone]
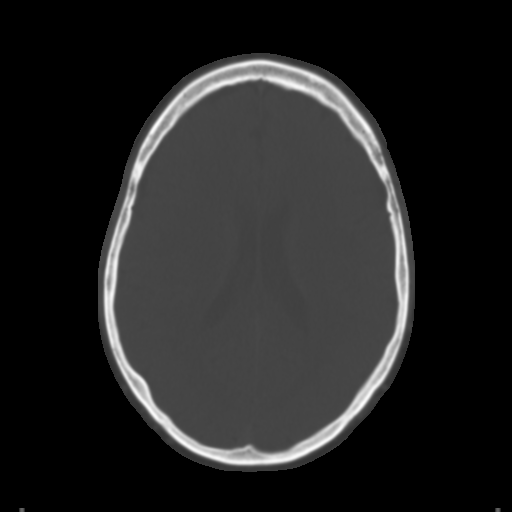
[im 21/34  brain]
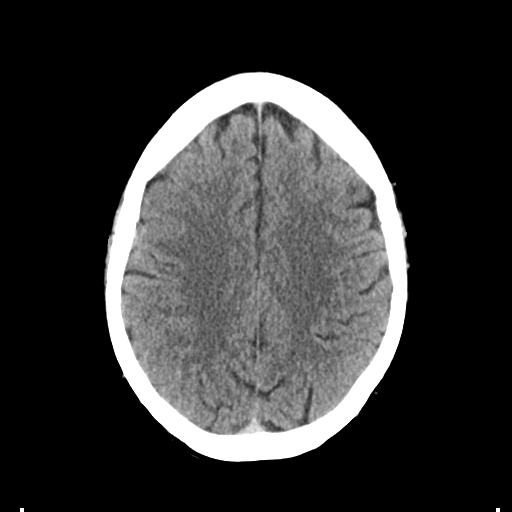
[im 24/34  brain]
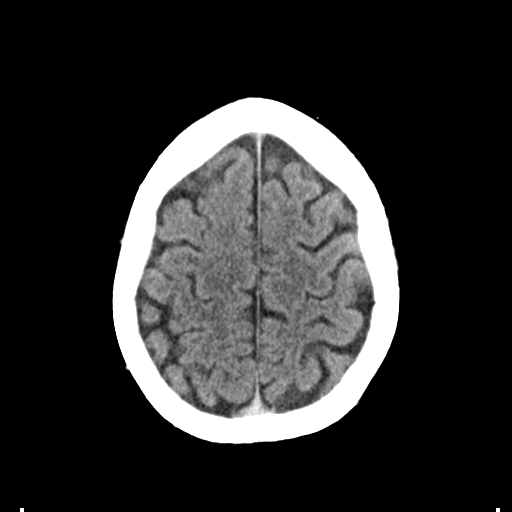
[im 28/34  brain]
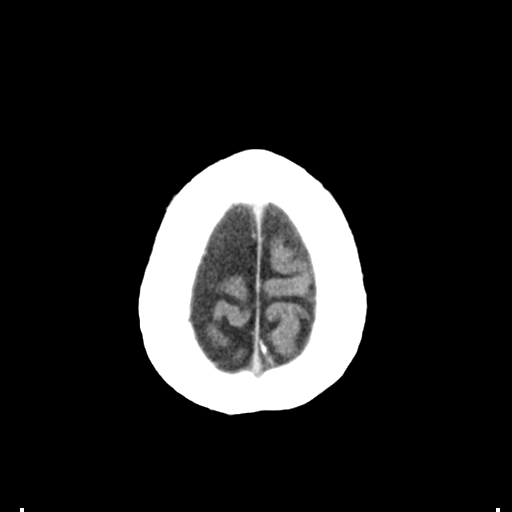
[im 31/34  brain]
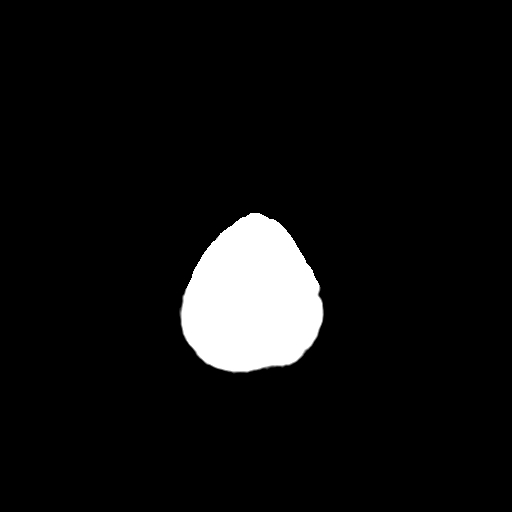
[im 31/34  bone]
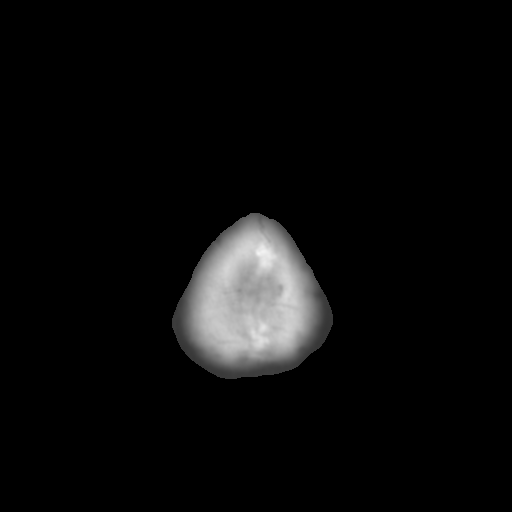

[Series 4: coronal soft tissue · coronal · 0.33mm/px · 3 of 70 slices shown]
[im 24/70  brain]
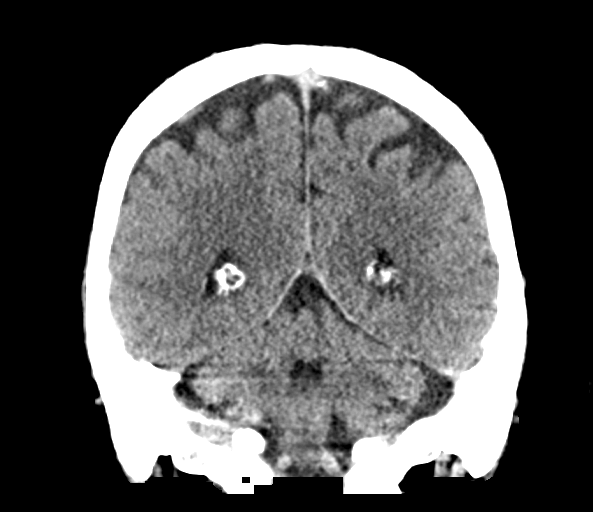
[im 31/70  brain]
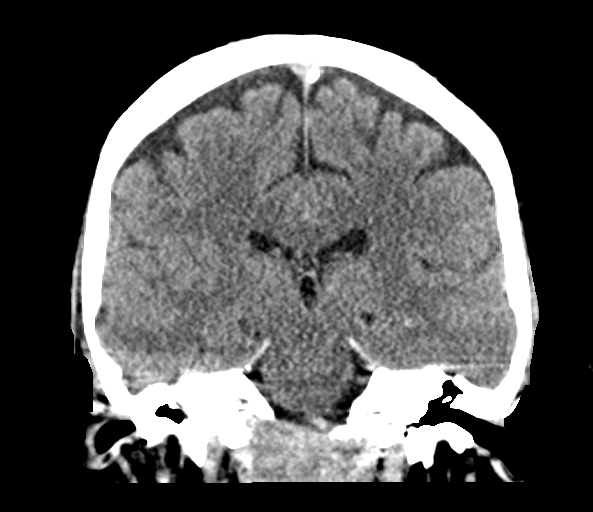
[im 39/70  brain]
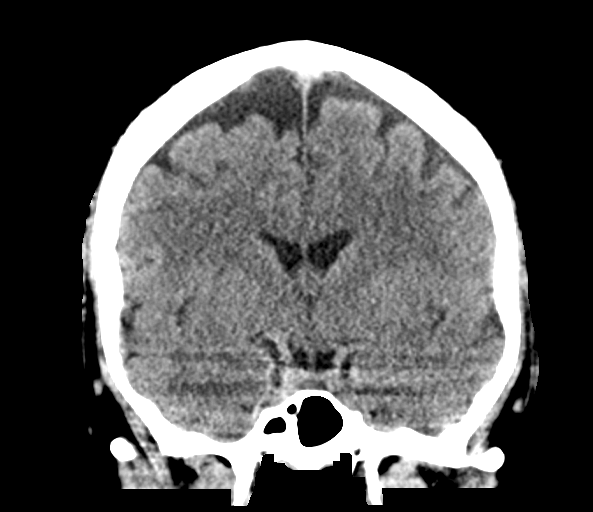

[Series 5: sagittal soft tissue · sagittal · 0.33mm/px · 3 of 55 slices shown]
[im 19/55  brain]
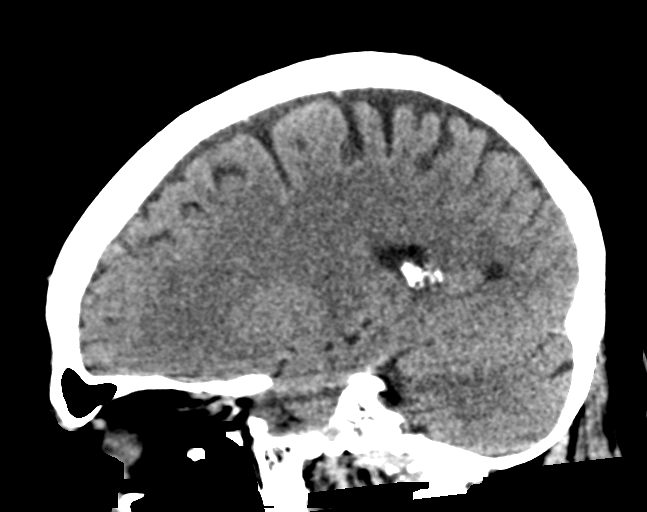
[im 28/55  brain]
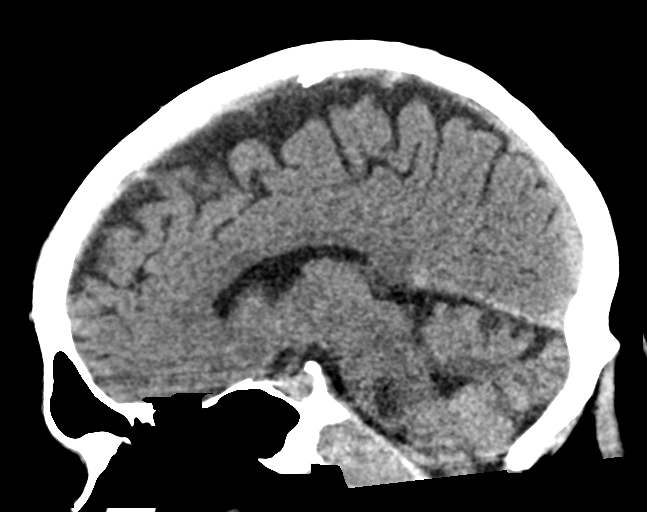
[im 37/55  brain]
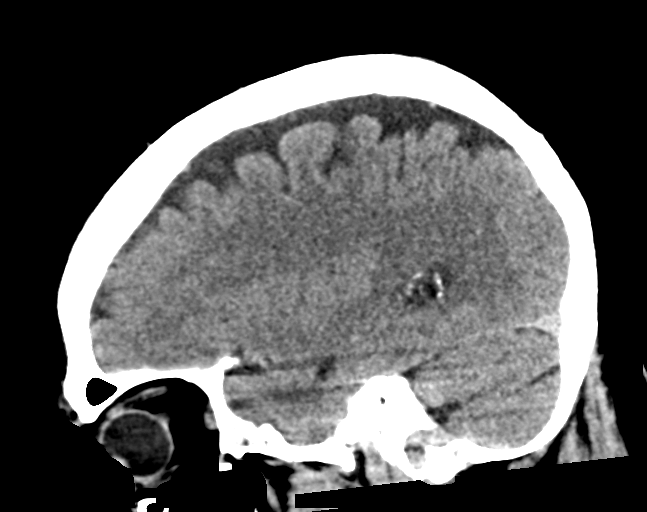

[15 of 47 positions shown; findings below may reference images not displayed]

FINDINGS: Brain: No evidence of acute infarction, hemorrhage, hydrocephalus,
extra-axial collection or mass lesion/mass effect.

Vascular: Negative for hyperdense vessel

Skull: Large mass in the central skull base. The inferior margin of
mass is not imaged on this study. The mass is slightly eccentric to
the right and involves the clivus and basiocciput. Margins are
ill-defined. The soft tissue in the tumor is slightly hyperdense to
brain and does not contain any calcifications internally. There is
destruction of the bone overlying the posterior aspect of the tumor
there may be some extraosseous extension of tumor however there is
no definite mass-effect on the pons.

Sinuses/Orbits: Mild mucosal edema sphenoid sinus. Remaining sinuses
clear. Negative orbit.

Other: None
IMPRESSION: Large central skull base tumor slightly eccentric to the right. The
mass is expansile and slightly hyperdense to brain. The mass is
mildly expansile and there may be some early intracranial extension
of tumor posteriorly. No internal calcifications are seen.
Differential diagnosis includes chordoma, chondroid tumor, and
metastatic disease. The patient had no given history of malignancy
at this time. MRI skull base without with contrast recommended for
further evaluation

These results were called by telephone at the time of interpretation
on 06/24/2019 at [DATE] to provider NENCE VELMURUGAN , who verbally
acknowledged these results.
